# Patient Record
Sex: Female | Born: 1980 | Race: White | Hispanic: No | Smoking: Never smoker
Health system: Southern US, Community
[De-identification: ages and names within clinical notes are randomized; demographics above are authoritative.]

---

## 2005-06-05 ENCOUNTER — Emergency Department (HOSPITAL_COMMUNITY): Admission: EM | Admit: 2005-06-05 | Discharge: 2005-06-05 | Payer: Self-pay | Admitting: Emergency Medicine

## 2005-06-07 ENCOUNTER — Emergency Department (HOSPITAL_COMMUNITY): Admission: EM | Admit: 2005-06-07 | Discharge: 2005-06-07 | Payer: Self-pay | Admitting: Family Medicine

## 2007-11-27 ENCOUNTER — Emergency Department (HOSPITAL_COMMUNITY): Admission: EM | Admit: 2007-11-27 | Discharge: 2007-11-27 | Payer: Self-pay | Admitting: Emergency Medicine
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
## 2017-01-25 ENCOUNTER — Ambulatory Visit: Payer: Self-pay | Admitting: Physician Assistant

## 2017-01-25 ENCOUNTER — Encounter: Payer: Self-pay | Admitting: Physician Assistant

## 2017-01-25 VITALS — BP 120/80 | HR 108 | Temp 98.7°F

## 2017-01-25 DIAGNOSIS — J069 Acute upper respiratory infection, unspecified: Secondary | ICD-10-CM

## 2017-01-25 LAB — POCT INFLUENZA A/B
INFLUENZA B, POC: NEGATIVE
Influenza A, POC: NEGATIVE

## 2017-01-25 MED ORDER — FLUTICASONE PROPIONATE 50 MCG/ACT NA SUSP: 2.0000 | Freq: Every day | NASAL | 6 refills | Status: AC

## 2017-01-25 MED ORDER — PREDNISONE 10 MG PO TABS: 30.0000 mg | ORAL_TABLET | Freq: Every day | ORAL | 0 refills | Status: AC

## 2017-01-25 NOTE — Progress Notes (Signed)
S: C/o runny nose and congestion for 3 days, no fever, chills, cp/sob, v/d; mucus was light yellow this am but clear throughout the day, cough is sporadic, ears hurt and feel stopped up  Using otc meds:   O: PE: vitals wnl, nad, perrl eomi, normocephalic, tms dull, nasal mucosa red and swollen with clear mucus in nasal cavity, throat injected, neck supple no lymph, lungs c t a, cv rrr, neuro intact  A:  Acute viral uri   P: drink fluids, continue regular meds , use otc meds of choice, return if not improving in 5 days, return earlier if worsening , flonase, sudafed, pred  qd x 3d, if not better by Monday will consider an antibiotic

## 2022-11-24 ENCOUNTER — Other Ambulatory Visit (HOSPITAL_COMMUNITY): Payer: Self-pay
